# Patient Record
Sex: Male | Born: 1950 | Race: White | Hispanic: No | Marital: Married | Smoking: Current every day smoker
Health system: Southern US, Community
[De-identification: ages and names within clinical notes are randomized; demographics above are authoritative.]

## PROBLEM LIST (undated history)

## (undated) DIAGNOSIS — E119 Type 2 diabetes mellitus without complications: Secondary | ICD-10-CM

## (undated) HISTORY — PX: APPENDECTOMY: SHX54

## (undated) HISTORY — PX: TONSILLECTOMY: SUR1361

---

## 2005-11-27 ENCOUNTER — Encounter: Admission: RE | Admit: 2005-11-27 | Discharge: 2005-11-27 | Payer: Self-pay | Admitting: Geriatric Medicine

## 2007-01-22 ENCOUNTER — Encounter: Admission: RE | Admit: 2007-01-22 | Discharge: 2007-01-22 | Payer: Self-pay | Admitting: Geriatric Medicine

## 2013-02-24 ENCOUNTER — Other Ambulatory Visit: Payer: Self-pay | Admitting: Geriatric Medicine

## 2013-02-24 DIAGNOSIS — F17200 Nicotine dependence, unspecified, uncomplicated: Secondary | ICD-10-CM

## 2013-02-25 ENCOUNTER — Ambulatory Visit
Admission: RE | Admit: 2013-02-25 | Discharge: 2013-02-25 | Disposition: A | Discharge status: Home / Self Care | Payer: No Typology Code available for payment source | Source: Ambulatory Visit | Attending: Geriatric Medicine | Admitting: Geriatric Medicine

## 2013-02-25 DIAGNOSIS — F17200 Nicotine dependence, unspecified, uncomplicated: Secondary | ICD-10-CM

## 2013-03-12 ENCOUNTER — Other Ambulatory Visit: Payer: Self-pay | Admitting: Internal Medicine

## 2015-03-02 ENCOUNTER — Other Ambulatory Visit: Payer: Self-pay | Admitting: Geriatric Medicine

## 2015-03-02 DIAGNOSIS — F17200 Nicotine dependence, unspecified, uncomplicated: Secondary | ICD-10-CM

## 2015-03-03 ENCOUNTER — Other Ambulatory Visit: Payer: Self-pay | Admitting: Geriatric Medicine

## 2015-03-13 ENCOUNTER — Ambulatory Visit
Admission: RE | Admit: 2015-03-13 | Discharge: 2015-03-13 | Disposition: A | Discharge status: Home / Self Care | Payer: BLUE CROSS/BLUE SHIELD | Source: Ambulatory Visit | Attending: Geriatric Medicine | Admitting: Geriatric Medicine

## 2015-03-13 DIAGNOSIS — F17200 Nicotine dependence, unspecified, uncomplicated: Secondary | ICD-10-CM

## 2015-08-29 DIAGNOSIS — F17200 Nicotine dependence, unspecified, uncomplicated: Secondary | ICD-10-CM | POA: Diagnosis not present

## 2015-08-29 DIAGNOSIS — Z79899 Other long term (current) drug therapy: Secondary | ICD-10-CM | POA: Diagnosis not present

## 2015-08-29 DIAGNOSIS — E119 Type 2 diabetes mellitus without complications: Secondary | ICD-10-CM | POA: Diagnosis not present

## 2015-08-29 DIAGNOSIS — E7800 Pure hypercholesterolemia, unspecified: Secondary | ICD-10-CM | POA: Diagnosis not present

## 2015-08-29 DIAGNOSIS — Z7984 Long term (current) use of oral hypoglycemic drugs: Secondary | ICD-10-CM | POA: Diagnosis not present

## 2015-08-29 DIAGNOSIS — E669 Obesity, unspecified: Secondary | ICD-10-CM | POA: Diagnosis not present

## 2015-08-29 DIAGNOSIS — Z6831 Body mass index (BMI) 31.0-31.9, adult: Secondary | ICD-10-CM | POA: Diagnosis not present

## 2015-11-15 DIAGNOSIS — C44622 Squamous cell carcinoma of skin of right upper limb, including shoulder: Secondary | ICD-10-CM | POA: Diagnosis not present

## 2015-11-15 DIAGNOSIS — L570 Actinic keratosis: Secondary | ICD-10-CM | POA: Diagnosis not present

## 2015-11-15 DIAGNOSIS — L821 Other seborrheic keratosis: Secondary | ICD-10-CM | POA: Diagnosis not present

## 2015-11-15 DIAGNOSIS — D485 Neoplasm of uncertain behavior of skin: Secondary | ICD-10-CM | POA: Diagnosis not present

## 2015-11-15 DIAGNOSIS — Z85828 Personal history of other malignant neoplasm of skin: Secondary | ICD-10-CM | POA: Diagnosis not present

## 2015-12-27 DIAGNOSIS — E119 Type 2 diabetes mellitus without complications: Secondary | ICD-10-CM | POA: Diagnosis not present

## 2015-12-27 DIAGNOSIS — Z7984 Long term (current) use of oral hypoglycemic drugs: Secondary | ICD-10-CM | POA: Diagnosis not present

## 2016-04-01 ENCOUNTER — Other Ambulatory Visit: Payer: Self-pay | Admitting: Geriatric Medicine

## 2016-04-01 DIAGNOSIS — E119 Type 2 diabetes mellitus without complications: Secondary | ICD-10-CM | POA: Diagnosis not present

## 2016-04-01 DIAGNOSIS — F17219 Nicotine dependence, cigarettes, with unspecified nicotine-induced disorders: Secondary | ICD-10-CM

## 2016-04-01 DIAGNOSIS — F17210 Nicotine dependence, cigarettes, uncomplicated: Secondary | ICD-10-CM | POA: Diagnosis not present

## 2016-04-01 DIAGNOSIS — Z0000 Encounter for general adult medical examination without abnormal findings: Secondary | ICD-10-CM | POA: Diagnosis not present

## 2016-04-01 DIAGNOSIS — E669 Obesity, unspecified: Secondary | ICD-10-CM | POA: Diagnosis not present

## 2016-04-01 DIAGNOSIS — Z79899 Other long term (current) drug therapy: Secondary | ICD-10-CM | POA: Diagnosis not present

## 2016-04-01 DIAGNOSIS — E7800 Pure hypercholesterolemia, unspecified: Secondary | ICD-10-CM | POA: Diagnosis not present

## 2016-04-01 DIAGNOSIS — Z23 Encounter for immunization: Secondary | ICD-10-CM | POA: Diagnosis not present

## 2016-04-01 DIAGNOSIS — Z1389 Encounter for screening for other disorder: Secondary | ICD-10-CM | POA: Diagnosis not present

## 2016-04-01 DIAGNOSIS — R0981 Nasal congestion: Secondary | ICD-10-CM | POA: Diagnosis not present

## 2016-04-01 DIAGNOSIS — Z7984 Long term (current) use of oral hypoglycemic drugs: Secondary | ICD-10-CM | POA: Diagnosis not present

## 2016-04-01 DIAGNOSIS — Z6832 Body mass index (BMI) 32.0-32.9, adult: Secondary | ICD-10-CM | POA: Diagnosis not present

## 2016-04-08 ENCOUNTER — Ambulatory Visit
Admission: RE | Admit: 2016-04-08 | Discharge: 2016-04-08 | Disposition: A | Discharge status: Home / Self Care | Payer: BLUE CROSS/BLUE SHIELD | Source: Ambulatory Visit | Attending: Geriatric Medicine | Admitting: Geriatric Medicine

## 2016-04-08 DIAGNOSIS — F17219 Nicotine dependence, cigarettes, with unspecified nicotine-induced disorders: Secondary | ICD-10-CM

## 2016-04-08 DIAGNOSIS — Z87891 Personal history of nicotine dependence: Secondary | ICD-10-CM | POA: Diagnosis not present

## 2016-04-18 ENCOUNTER — Other Ambulatory Visit: Payer: Self-pay | Admitting: Gastroenterology

## 2016-04-24 DIAGNOSIS — F17200 Nicotine dependence, unspecified, uncomplicated: Secondary | ICD-10-CM | POA: Diagnosis not present

## 2016-06-04 ENCOUNTER — Encounter (HOSPITAL_COMMUNITY)
Admission: RE | Disposition: A | Discharge status: Home / Self Care | Payer: Self-pay | Source: Ambulatory Visit | Attending: Gastroenterology

## 2016-06-04 ENCOUNTER — Ambulatory Visit (HOSPITAL_COMMUNITY): Payer: Medicare Other | Admitting: Anesthesiology

## 2016-06-04 ENCOUNTER — Ambulatory Visit (HOSPITAL_COMMUNITY)
Admission: RE | Admit: 2016-06-04 | Discharge: 2016-06-04 | Disposition: A | Discharge status: Home / Self Care | Payer: Medicare Other | Source: Ambulatory Visit | Attending: Gastroenterology | Admitting: Gastroenterology

## 2016-06-04 ENCOUNTER — Encounter (HOSPITAL_COMMUNITY): Payer: Self-pay

## 2016-06-04 DIAGNOSIS — F17210 Nicotine dependence, cigarettes, uncomplicated: Secondary | ICD-10-CM | POA: Insufficient documentation

## 2016-06-04 DIAGNOSIS — Z1211 Encounter for screening for malignant neoplasm of colon: Secondary | ICD-10-CM | POA: Insufficient documentation

## 2016-06-04 DIAGNOSIS — E119 Type 2 diabetes mellitus without complications: Secondary | ICD-10-CM | POA: Insufficient documentation

## 2016-06-04 HISTORY — PX: COLONOSCOPY WITH PROPOFOL: SHX5780

## 2016-06-04 HISTORY — DX: Type 2 diabetes mellitus without complications: E11.9

## 2016-06-04 SURGERY — COLONOSCOPY WITH PROPOFOL
Anesthesia: Monitor Anesthesia Care

## 2016-06-04 MED ORDER — SODIUM CHLORIDE 0.9 % IV SOLN: INTRAVENOUS | Status: DC

## 2016-06-04 MED ORDER — PROPOFOL 10 MG/ML IV BOLUS
INTRAVENOUS | Status: AC
  Filled 2016-06-04: qty 20

## 2016-06-04 MED ORDER — LACTATED RINGERS IV SOLN
INTRAVENOUS | Status: DC
  Administered 2016-06-04: 12:00:00 via INTRAVENOUS

## 2016-06-04 MED ORDER — PROPOFOL 10 MG/ML IV BOLUS
INTRAVENOUS | Status: DC | PRN
  Administered 2016-06-04: 20 mg via INTRAVENOUS
  Administered 2016-06-04: 40 mg via INTRAVENOUS
  Administered 2016-06-04 (×2): 20 mg via INTRAVENOUS
  Administered 2016-06-04 (×4): 40 mg via INTRAVENOUS

## 2016-06-04 SURGICAL SUPPLY — 22 items

## 2016-06-04 NOTE — Op Note (Signed)
[facility] Patient Name: Lemichael Konig Procedure Date: 06/04/2016 MRN: [id number] Attending MD: Garlan Fair , MD Date of Birth: 17-Jun-1950 CSN: [id number] Age: 66 Admit Type: Outpatient Procedure:                Colonoscopy Indications:              Screening for colorectal malignant neoplasm Providers:                Garlan Fair, MD, Carmie End, RN, Elspeth Cho Tech., Technician, Dion Saucier, CRNA Referring MD:              Medicines:                Propofol per Anesthesia Complications:            No immediate complications. Estimated Blood Loss:     Estimated blood loss: none. Procedure:                Pre-Anesthesia Assessment:                           - Prior to the procedure, a History and Physical                            was performed, and patient medications and                            allergies were reviewed. The patient's tolerance of                            previous anesthesia was also reviewed. The risks                            and benefits of the procedure and the sedation                            options and risks were discussed with the patient.                            All questions were answered, and informed consent                            was obtained. Prior Anticoagulants: The patient has                            taken aspirin, last dose was day of procedure. ASA                            Grade Assessment: III - A patient with severe                            systemic disease. After reviewing the risks and  benefits, the patient was deemed in satisfactory                            condition to undergo the procedure.                           After obtaining informed consent, the colonoscope                            was passed under direct vision. Throughout the                            procedure, the patient's blood pressure, pulse, and              oxygen saturations were monitored continuously. The                            [id number] scope was introduced through                            the anus and advanced to the the cecum, identified                            by appendiceal orifice and ileocecal valve. The                            colonoscopy was somewhat difficult due to                            significant looping. The patient tolerated the                            procedure well. The quality of the bowel                            preparation was good. The appendiceal orifice and                            the rectum were photographed. Scope In: 1:14:43 PM Scope Out: 1:32:37 PM Scope Withdrawal Time: 0 hours 8 minutes 20 seconds  Total Procedure Duration: 0 hours 17 minutes 54 seconds  Findings:      The perianal and digital rectal examinations were normal.      The entire examined colon appeared normal. Impression:               - The entire examined colon is normal.                           - No specimens collected. Moderate Sedation:      N/A- Per Anesthesia Care Recommendation:           - Patient has a contact number available for                            emergencies. The signs and symptoms of potential  delayed complications were discussed with the                            patient. Return to normal activities tomorrow.                            Written discharge instructions were provided to the                            patient.                           - Repeat colonoscopy in 10 years for screening                            purposes.                           - Resume previous diet.                           - Continue present medications. Procedure Code(s):        --- Professional ---                           [id number], Colorectal cancer screening; colonoscopy on                            individual not meeting criteria for high risk Diagnosis  Code(s):        --- Professional ---                           Z12.11, Encounter for screening for malignant                            neoplasm of colon CPT copyright 2016 [employer]. All rights reserved. The codes documented in this report are preliminary and upon coder review may  be revised to meet current compliance requirements. Earle Gell, MD Garlan Fair, MD 06/04/2016 1:40:13 PM This report has been signed electronically. Number of Addenda: 0

## 2016-06-04 NOTE — Transfer of Care (Signed)
Immediate Anesthesia Transfer of Care Note  Patient: Thomas Callahan  Procedure(s) Performed: Procedure(s): COLONOSCOPY WITH PROPOFOL (N/A)  Patient Location: PACU and Endoscopy Unit  Anesthesia Type:MAC  Level of Consciousness: awake, alert , oriented and patient cooperative  Airway & Oxygen Therapy: Patient Spontanous Breathing and Patient connected to face mask oxygen  Post-op Assessment: Report given to RN, Post -op Vital signs reviewed and stable and Patient moving all extremities  Post vital signs: Reviewed and stable  Last Vitals:  Vitals:   06/04/16 1127  BP: 138/85  Resp: 18  Temp: 36.4 C    Last Pain:  Vitals:   06/04/16 1127  TempSrc: Oral         Complications: No apparent anesthesia complications

## 2016-06-04 NOTE — Anesthesia Preprocedure Evaluation (Signed)
Anesthesia Evaluation  Patient identified by MRN, date of birth, ID band Patient awake    Reviewed: Allergy & Precautions, NPO status , Patient's Chart, lab work & pertinent test results  Airway Mallampati: II  TM Distance: >3 FB Neck ROM: Full    Dental no notable dental hx.    Pulmonary Current Smoker,    Pulmonary exam normal breath sounds clear to auscultation       Cardiovascular negative cardio ROS Normal cardiovascular exam Rhythm:Regular Rate:Normal     Neuro/Psych negative neurological ROS  negative psych ROS   GI/Hepatic negative GI ROS, Neg liver ROS,   Endo/Other  diabetes  Renal/GU negative Renal ROS  negative genitourinary   Musculoskeletal negative musculoskeletal ROS (+)   Abdominal   Peds negative pediatric ROS (+)  Hematology negative hematology ROS (+)   Anesthesia Other Findings   Reproductive/Obstetrics negative OB ROS                             Anesthesia Physical Anesthesia Plan  ASA: III  Anesthesia Plan: MAC   Post-op Pain Management:    Induction: Intravenous  Airway Management Planned: Nasal Cannula  Additional Equipment:   Intra-op Plan:   Post-operative Plan:   Informed Consent: I have reviewed the patients History and Physical, chart, labs and discussed the procedure including the risks, benefits and alternatives for the proposed anesthesia with the patient or authorized representative who has indicated his/her understanding and acceptance.   Dental advisory given  Plan Discussed with: CRNA and Surgeon  Anesthesia Plan Comments:         Anesthesia Quick Evaluation

## 2016-06-04 NOTE — Discharge Instructions (Signed)

## 2016-06-04 NOTE — Anesthesia Postprocedure Evaluation (Addendum)
Anesthesia Post Note  Patient: Thomas Callahan  Procedure(s) Performed: Procedure(s) (LRB): COLONOSCOPY WITH PROPOFOL (N/A)  Patient location during evaluation: PACU Anesthesia Type: MAC Level of consciousness: awake and alert Pain management: pain level controlled Vital Signs Assessment: post-procedure vital signs reviewed and stable Respiratory status: spontaneous breathing, nonlabored ventilation, respiratory function stable and patient connected to nasal cannula oxygen Cardiovascular status: stable and blood pressure returned to baseline Anesthetic complications: no       Last Vitals:  Vitals:   06/04/16 1127 06/04/16 1339  BP: 138/85 (!) 154/93  Pulse:  75  Resp: 18 (!) 23  Temp: 36.4 C 36.6 C    Last Pain:  Vitals:   06/04/16 1339  TempSrc: Oral                 Driana Dazey S

## 2016-06-04 NOTE — H&P (Signed)
Procedure: Screening colonoscopy. Normal screening colonoscopy was performed on 12/31/2005  History: The patient is a 66 year old male born 1950-11-29. He is scheduled to undergo a repeat screening colonoscopy today  Past medical history: Type 2 diabetes mellitus. Nasal fracture surgery. Inguinal hernia repair. Arm fracture surgery. Current cigarette smoker.  Exam: The patient is alert and lying comfortably on the endoscopy stretcher. Abdomen is soft and nontender to palpation. Lungs are clear to auscultation. Cardiac exam reveals a regular rhythm.  Plan: Proceed with screening colonoscopy

## 2016-06-05 ENCOUNTER — Encounter (HOSPITAL_COMMUNITY): Payer: Self-pay | Admitting: Gastroenterology

## 2016-09-23 DIAGNOSIS — Z79899 Other long term (current) drug therapy: Secondary | ICD-10-CM | POA: Diagnosis not present

## 2016-09-23 DIAGNOSIS — Z7984 Long term (current) use of oral hypoglycemic drugs: Secondary | ICD-10-CM | POA: Diagnosis not present

## 2016-09-23 DIAGNOSIS — E7800 Pure hypercholesterolemia, unspecified: Secondary | ICD-10-CM | POA: Diagnosis not present

## 2016-09-23 DIAGNOSIS — E119 Type 2 diabetes mellitus without complications: Secondary | ICD-10-CM | POA: Diagnosis not present

## 2016-09-23 DIAGNOSIS — F17210 Nicotine dependence, cigarettes, uncomplicated: Secondary | ICD-10-CM | POA: Diagnosis not present

## 2016-10-07 NOTE — Addendum Note (Signed)
Addendum  created 10/07/16 1033 by Truda Staub, MD   Sign clinical note    

## 2016-11-14 DIAGNOSIS — L814 Other melanin hyperpigmentation: Secondary | ICD-10-CM | POA: Diagnosis not present

## 2016-11-14 DIAGNOSIS — D1801 Hemangioma of skin and subcutaneous tissue: Secondary | ICD-10-CM | POA: Diagnosis not present

## 2016-11-14 DIAGNOSIS — D225 Melanocytic nevi of trunk: Secondary | ICD-10-CM | POA: Diagnosis not present

## 2016-11-14 DIAGNOSIS — L821 Other seborrheic keratosis: Secondary | ICD-10-CM | POA: Diagnosis not present

## 2016-11-14 DIAGNOSIS — D485 Neoplasm of uncertain behavior of skin: Secondary | ICD-10-CM | POA: Diagnosis not present

## 2016-11-14 DIAGNOSIS — L570 Actinic keratosis: Secondary | ICD-10-CM | POA: Diagnosis not present

## 2016-11-14 DIAGNOSIS — Z85828 Personal history of other malignant neoplasm of skin: Secondary | ICD-10-CM | POA: Diagnosis not present

## 2016-11-14 DIAGNOSIS — C44619 Basal cell carcinoma of skin of left upper limb, including shoulder: Secondary | ICD-10-CM | POA: Diagnosis not present

## 2017-03-19 ENCOUNTER — Other Ambulatory Visit: Payer: Self-pay | Admitting: Geriatric Medicine

## 2017-03-19 DIAGNOSIS — E7800 Pure hypercholesterolemia, unspecified: Secondary | ICD-10-CM | POA: Diagnosis not present

## 2017-03-19 DIAGNOSIS — E119 Type 2 diabetes mellitus without complications: Secondary | ICD-10-CM | POA: Diagnosis not present

## 2017-03-19 DIAGNOSIS — F17210 Nicotine dependence, cigarettes, uncomplicated: Secondary | ICD-10-CM

## 2017-03-19 DIAGNOSIS — Z23 Encounter for immunization: Secondary | ICD-10-CM | POA: Diagnosis not present

## 2017-03-19 DIAGNOSIS — Z125 Encounter for screening for malignant neoplasm of prostate: Secondary | ICD-10-CM | POA: Diagnosis not present

## 2017-03-19 DIAGNOSIS — Z0000 Encounter for general adult medical examination without abnormal findings: Secondary | ICD-10-CM | POA: Diagnosis not present

## 2017-03-19 DIAGNOSIS — Z79899 Other long term (current) drug therapy: Secondary | ICD-10-CM | POA: Diagnosis not present

## 2017-03-19 DIAGNOSIS — Z1389 Encounter for screening for other disorder: Secondary | ICD-10-CM | POA: Diagnosis not present

## 2017-03-19 DIAGNOSIS — Z87891 Personal history of nicotine dependence: Secondary | ICD-10-CM

## 2017-03-19 DIAGNOSIS — E669 Obesity, unspecified: Secondary | ICD-10-CM | POA: Diagnosis not present

## 2017-03-19 DIAGNOSIS — D7589 Other specified diseases of blood and blood-forming organs: Secondary | ICD-10-CM | POA: Diagnosis not present

## 2017-03-19 DIAGNOSIS — Z7984 Long term (current) use of oral hypoglycemic drugs: Secondary | ICD-10-CM | POA: Diagnosis not present

## 2017-03-26 ENCOUNTER — Ambulatory Visit
Admission: RE | Admit: 2017-03-26 | Discharge: 2017-03-26 | Disposition: A | Discharge status: Home / Self Care | Payer: Medicare Other | Source: Ambulatory Visit | Attending: Geriatric Medicine | Admitting: Geriatric Medicine

## 2017-03-26 DIAGNOSIS — Z87891 Personal history of nicotine dependence: Secondary | ICD-10-CM

## 2017-03-26 DIAGNOSIS — Z136 Encounter for screening for cardiovascular disorders: Secondary | ICD-10-CM | POA: Diagnosis not present

## 2017-03-26 DIAGNOSIS — F17210 Nicotine dependence, cigarettes, uncomplicated: Secondary | ICD-10-CM

## 2017-09-19 DIAGNOSIS — Z7984 Long term (current) use of oral hypoglycemic drugs: Secondary | ICD-10-CM | POA: Diagnosis not present

## 2017-09-19 DIAGNOSIS — Z6831 Body mass index (BMI) 31.0-31.9, adult: Secondary | ICD-10-CM | POA: Diagnosis not present

## 2017-09-19 DIAGNOSIS — E119 Type 2 diabetes mellitus without complications: Secondary | ICD-10-CM | POA: Diagnosis not present

## 2017-09-19 DIAGNOSIS — F17210 Nicotine dependence, cigarettes, uncomplicated: Secondary | ICD-10-CM | POA: Diagnosis not present

## 2017-09-19 DIAGNOSIS — Z79899 Other long term (current) drug therapy: Secondary | ICD-10-CM | POA: Diagnosis not present

## 2017-09-19 DIAGNOSIS — E669 Obesity, unspecified: Secondary | ICD-10-CM | POA: Diagnosis not present

## 2017-11-18 DIAGNOSIS — L821 Other seborrheic keratosis: Secondary | ICD-10-CM | POA: Diagnosis not present

## 2017-11-18 DIAGNOSIS — D485 Neoplasm of uncertain behavior of skin: Secondary | ICD-10-CM | POA: Diagnosis not present

## 2017-11-18 DIAGNOSIS — L570 Actinic keratosis: Secondary | ICD-10-CM | POA: Diagnosis not present

## 2017-11-18 DIAGNOSIS — Z85828 Personal history of other malignant neoplasm of skin: Secondary | ICD-10-CM | POA: Diagnosis not present

## 2018-03-20 DIAGNOSIS — Z0000 Encounter for general adult medical examination without abnormal findings: Secondary | ICD-10-CM | POA: Diagnosis not present

## 2018-03-20 DIAGNOSIS — E7800 Pure hypercholesterolemia, unspecified: Secondary | ICD-10-CM | POA: Diagnosis not present

## 2018-03-20 DIAGNOSIS — F17200 Nicotine dependence, unspecified, uncomplicated: Secondary | ICD-10-CM | POA: Diagnosis not present

## 2018-03-20 DIAGNOSIS — E119 Type 2 diabetes mellitus without complications: Secondary | ICD-10-CM | POA: Diagnosis not present

## 2018-03-20 DIAGNOSIS — J309 Allergic rhinitis, unspecified: Secondary | ICD-10-CM | POA: Diagnosis not present

## 2018-03-20 DIAGNOSIS — Z1389 Encounter for screening for other disorder: Secondary | ICD-10-CM | POA: Diagnosis not present

## 2018-03-20 DIAGNOSIS — Z23 Encounter for immunization: Secondary | ICD-10-CM | POA: Diagnosis not present

## 2018-03-20 DIAGNOSIS — Z79899 Other long term (current) drug therapy: Secondary | ICD-10-CM | POA: Diagnosis not present

## 2018-03-20 DIAGNOSIS — Z125 Encounter for screening for malignant neoplasm of prostate: Secondary | ICD-10-CM | POA: Diagnosis not present

## 2018-03-20 DIAGNOSIS — F17210 Nicotine dependence, cigarettes, uncomplicated: Secondary | ICD-10-CM | POA: Diagnosis not present

## 2018-07-23 DIAGNOSIS — E119 Type 2 diabetes mellitus without complications: Secondary | ICD-10-CM | POA: Diagnosis not present

## 2018-09-16 DIAGNOSIS — E1169 Type 2 diabetes mellitus with other specified complication: Secondary | ICD-10-CM | POA: Diagnosis not present

## 2018-09-16 DIAGNOSIS — R21 Rash and other nonspecific skin eruption: Secondary | ICD-10-CM | POA: Diagnosis not present

## 2018-11-24 DIAGNOSIS — D1801 Hemangioma of skin and subcutaneous tissue: Secondary | ICD-10-CM | POA: Diagnosis not present

## 2018-11-24 DIAGNOSIS — Z85828 Personal history of other malignant neoplasm of skin: Secondary | ICD-10-CM | POA: Diagnosis not present

## 2018-11-24 DIAGNOSIS — L570 Actinic keratosis: Secondary | ICD-10-CM | POA: Diagnosis not present

## 2018-11-24 DIAGNOSIS — D225 Melanocytic nevi of trunk: Secondary | ICD-10-CM | POA: Diagnosis not present

## 2018-11-24 DIAGNOSIS — L821 Other seborrheic keratosis: Secondary | ICD-10-CM | POA: Diagnosis not present

## 2018-12-17 DIAGNOSIS — E1169 Type 2 diabetes mellitus with other specified complication: Secondary | ICD-10-CM | POA: Diagnosis not present

## 2018-12-25 DIAGNOSIS — E7800 Pure hypercholesterolemia, unspecified: Secondary | ICD-10-CM | POA: Diagnosis not present

## 2018-12-25 DIAGNOSIS — E119 Type 2 diabetes mellitus without complications: Secondary | ICD-10-CM | POA: Diagnosis not present

## 2018-12-25 DIAGNOSIS — E1169 Type 2 diabetes mellitus with other specified complication: Secondary | ICD-10-CM | POA: Diagnosis not present

## 2019-03-03 DIAGNOSIS — Z23 Encounter for immunization: Secondary | ICD-10-CM | POA: Diagnosis not present

## 2019-04-05 ENCOUNTER — Other Ambulatory Visit: Payer: Self-pay | Admitting: Geriatric Medicine

## 2019-04-05 DIAGNOSIS — Z79899 Other long term (current) drug therapy: Secondary | ICD-10-CM | POA: Diagnosis not present

## 2019-04-05 DIAGNOSIS — R21 Rash and other nonspecific skin eruption: Secondary | ICD-10-CM | POA: Diagnosis not present

## 2019-04-05 DIAGNOSIS — F17209 Nicotine dependence, unspecified, with unspecified nicotine-induced disorders: Secondary | ICD-10-CM

## 2019-04-05 DIAGNOSIS — Z0000 Encounter for general adult medical examination without abnormal findings: Secondary | ICD-10-CM | POA: Diagnosis not present

## 2019-04-05 DIAGNOSIS — Z1389 Encounter for screening for other disorder: Secondary | ICD-10-CM | POA: Diagnosis not present

## 2019-04-05 DIAGNOSIS — Z125 Encounter for screening for malignant neoplasm of prostate: Secondary | ICD-10-CM | POA: Diagnosis not present

## 2019-04-05 DIAGNOSIS — Z23 Encounter for immunization: Secondary | ICD-10-CM | POA: Diagnosis not present

## 2019-04-05 DIAGNOSIS — F17211 Nicotine dependence, cigarettes, in remission: Secondary | ICD-10-CM | POA: Diagnosis not present

## 2019-04-05 DIAGNOSIS — E1169 Type 2 diabetes mellitus with other specified complication: Secondary | ICD-10-CM | POA: Diagnosis not present

## 2019-04-05 DIAGNOSIS — E7800 Pure hypercholesterolemia, unspecified: Secondary | ICD-10-CM | POA: Diagnosis not present

## 2019-04-07 ENCOUNTER — Ambulatory Visit
Admission: RE | Admit: 2019-04-07 | Discharge: 2019-04-07 | Disposition: A | Discharge status: Home / Self Care | Payer: Medicare Other | Source: Ambulatory Visit | Attending: Geriatric Medicine | Admitting: Geriatric Medicine

## 2019-04-07 DIAGNOSIS — Z87891 Personal history of nicotine dependence: Secondary | ICD-10-CM | POA: Diagnosis not present

## 2019-04-07 DIAGNOSIS — F17209 Nicotine dependence, unspecified, with unspecified nicotine-induced disorders: Secondary | ICD-10-CM

## 2019-06-10 DIAGNOSIS — Z23 Encounter for immunization: Secondary | ICD-10-CM | POA: Diagnosis not present

## 2019-06-25 DIAGNOSIS — E1169 Type 2 diabetes mellitus with other specified complication: Secondary | ICD-10-CM | POA: Diagnosis not present

## 2019-06-25 DIAGNOSIS — E7800 Pure hypercholesterolemia, unspecified: Secondary | ICD-10-CM | POA: Diagnosis not present

## 2019-08-03 DIAGNOSIS — E1169 Type 2 diabetes mellitus with other specified complication: Secondary | ICD-10-CM | POA: Diagnosis not present

## 2019-08-03 DIAGNOSIS — Z79899 Other long term (current) drug therapy: Secondary | ICD-10-CM | POA: Diagnosis not present

## 2019-08-03 DIAGNOSIS — I700 Atherosclerosis of aorta: Secondary | ICD-10-CM | POA: Diagnosis not present

## 2019-08-03 DIAGNOSIS — E7800 Pure hypercholesterolemia, unspecified: Secondary | ICD-10-CM | POA: Diagnosis not present

## 2019-08-03 DIAGNOSIS — Z7984 Long term (current) use of oral hypoglycemic drugs: Secondary | ICD-10-CM | POA: Diagnosis not present

## 2019-08-03 DIAGNOSIS — Z23 Encounter for immunization: Secondary | ICD-10-CM | POA: Diagnosis not present

## 2019-11-24 DIAGNOSIS — D485 Neoplasm of uncertain behavior of skin: Secondary | ICD-10-CM | POA: Diagnosis not present

## 2019-11-24 DIAGNOSIS — C44622 Squamous cell carcinoma of skin of right upper limb, including shoulder: Secondary | ICD-10-CM | POA: Diagnosis not present

## 2019-11-24 DIAGNOSIS — L821 Other seborrheic keratosis: Secondary | ICD-10-CM | POA: Diagnosis not present

## 2019-11-24 DIAGNOSIS — D225 Melanocytic nevi of trunk: Secondary | ICD-10-CM | POA: Diagnosis not present

## 2019-11-24 DIAGNOSIS — D1801 Hemangioma of skin and subcutaneous tissue: Secondary | ICD-10-CM | POA: Diagnosis not present

## 2019-11-24 DIAGNOSIS — L570 Actinic keratosis: Secondary | ICD-10-CM | POA: Diagnosis not present

## 2019-11-24 DIAGNOSIS — Z85828 Personal history of other malignant neoplasm of skin: Secondary | ICD-10-CM | POA: Diagnosis not present

## 2019-11-24 DIAGNOSIS — L814 Other melanin hyperpigmentation: Secondary | ICD-10-CM | POA: Diagnosis not present

## 2020-03-01 DIAGNOSIS — Z23 Encounter for immunization: Secondary | ICD-10-CM | POA: Diagnosis not present

## 2020-03-14 DIAGNOSIS — Z23 Encounter for immunization: Secondary | ICD-10-CM | POA: Diagnosis not present

## 2020-04-05 ENCOUNTER — Other Ambulatory Visit: Payer: Self-pay | Admitting: Geriatric Medicine

## 2020-04-05 DIAGNOSIS — F17211 Nicotine dependence, cigarettes, in remission: Secondary | ICD-10-CM

## 2020-04-05 DIAGNOSIS — Z7984 Long term (current) use of oral hypoglycemic drugs: Secondary | ICD-10-CM | POA: Diagnosis not present

## 2020-04-05 DIAGNOSIS — E1169 Type 2 diabetes mellitus with other specified complication: Secondary | ICD-10-CM | POA: Diagnosis not present

## 2020-04-05 DIAGNOSIS — Z1389 Encounter for screening for other disorder: Secondary | ICD-10-CM | POA: Diagnosis not present

## 2020-04-05 DIAGNOSIS — Z125 Encounter for screening for malignant neoplasm of prostate: Secondary | ICD-10-CM | POA: Diagnosis not present

## 2020-04-05 DIAGNOSIS — I700 Atherosclerosis of aorta: Secondary | ICD-10-CM | POA: Diagnosis not present

## 2020-04-05 DIAGNOSIS — Z79899 Other long term (current) drug therapy: Secondary | ICD-10-CM | POA: Diagnosis not present

## 2020-04-05 DIAGNOSIS — E7800 Pure hypercholesterolemia, unspecified: Secondary | ICD-10-CM | POA: Diagnosis not present

## 2020-04-05 DIAGNOSIS — Z0000 Encounter for general adult medical examination without abnormal findings: Secondary | ICD-10-CM | POA: Diagnosis not present

## 2020-04-26 ENCOUNTER — Ambulatory Visit
Admission: RE | Admit: 2020-04-26 | Discharge: 2020-04-26 | Disposition: A | Discharge status: Home / Self Care | Payer: Medicare Other | Source: Ambulatory Visit | Attending: Geriatric Medicine | Admitting: Geriatric Medicine

## 2020-04-26 DIAGNOSIS — Z87891 Personal history of nicotine dependence: Secondary | ICD-10-CM | POA: Diagnosis not present

## 2020-04-26 DIAGNOSIS — F17211 Nicotine dependence, cigarettes, in remission: Secondary | ICD-10-CM

## 2020-04-26 DIAGNOSIS — I2510 Atherosclerotic heart disease of native coronary artery without angina pectoris: Secondary | ICD-10-CM | POA: Diagnosis not present

## 2020-04-26 DIAGNOSIS — I358 Other nonrheumatic aortic valve disorders: Secondary | ICD-10-CM | POA: Diagnosis not present

## 2020-04-26 DIAGNOSIS — J432 Centrilobular emphysema: Secondary | ICD-10-CM | POA: Diagnosis not present

## 2020-12-26 IMAGING — CT CT CHEST LUNG CANCER SCREENING LOW DOSE W/O CM
2 of 5 series · 15 of 40 positions shown, 18 images · non-contrast
Comparison: Low-dose lung cancer screening CT chest dated
03/26/2017

CLINICAL DATA: 68-year-old male former smoker, quit 1 year ago,
with 49 pack-year history of smoking, for follow-up lung cancer
screening

EXAM:
CT CHEST WITHOUT CONTRAST LOW-DOSE FOR LUNG CANCER SCREENING
TECHNIQUE: Multidetector CT imaging of the chest was performed following the
standard protocol without IV contrast.

[Series 4: lung 1.00 br44 cor · coronal · 0.69mm/px · 3 of 355 slices shown]
[im 71/355  lung]
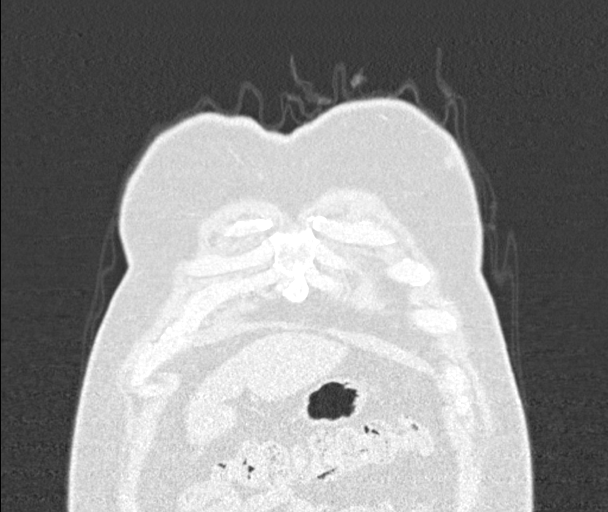
[im 142/355  lung]
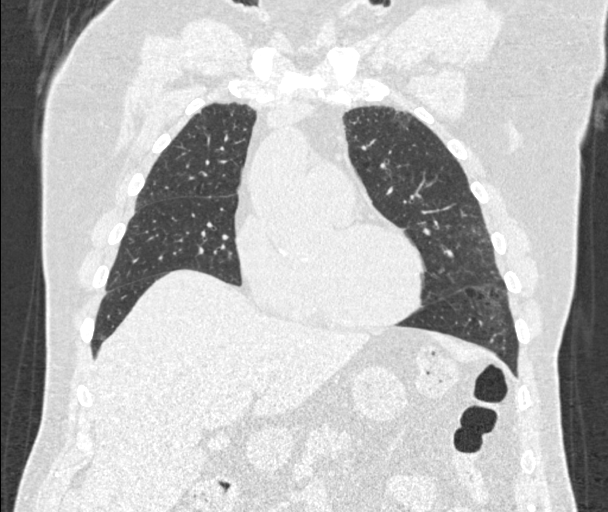
[im 213/355  lung]
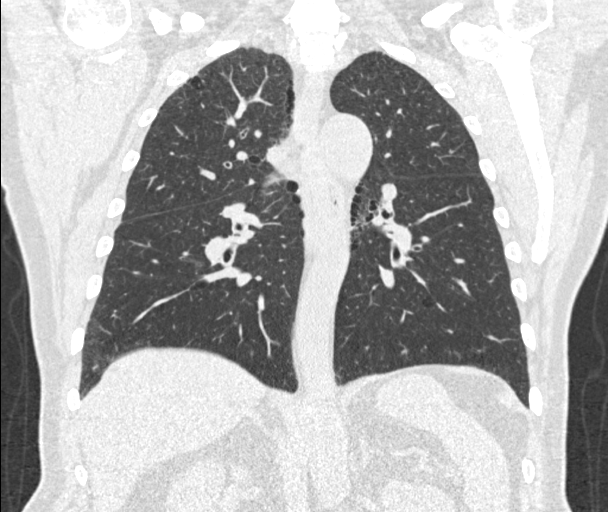

[Series 9: lung 1.00 br60 axial · axial · 0.82mm/px · z∈[-1057,-738]mm · 12 of 353 slices shown, 15 images]
[im 17/353  mediastinal]
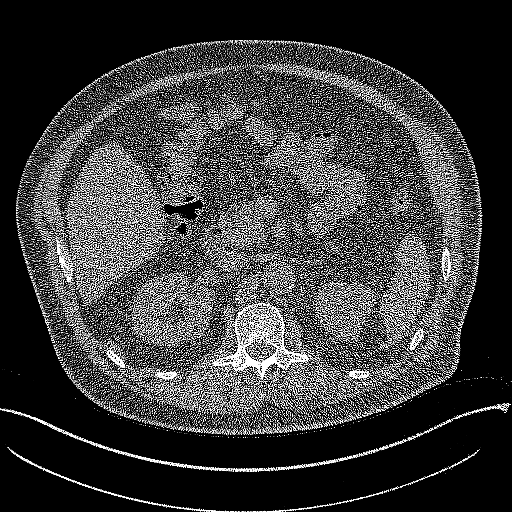
[im 17/353  lung]
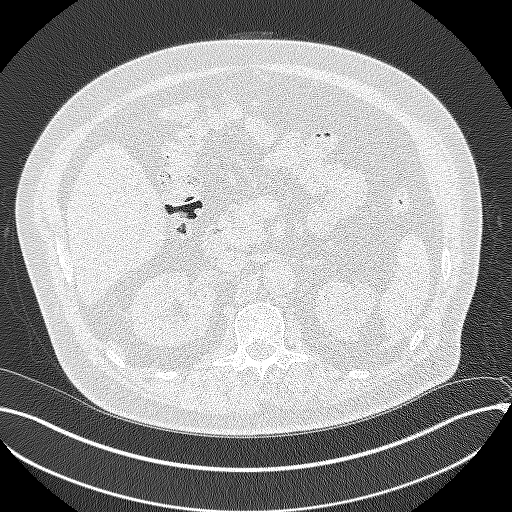
[im 51/353  lung]
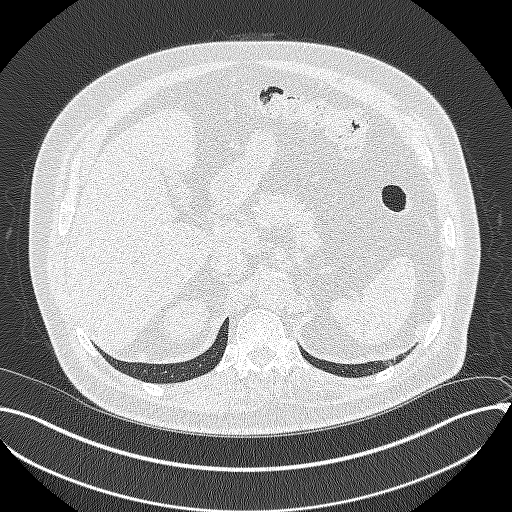
[im 84/353  lung]
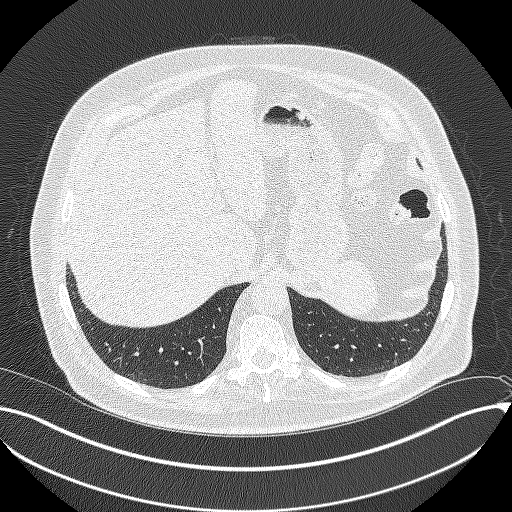
[im 101/353  lung]
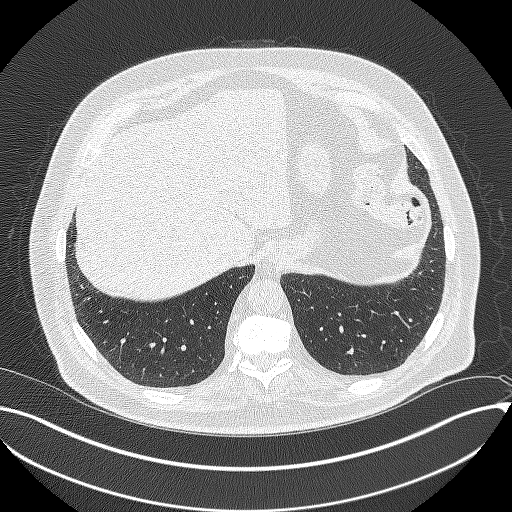
[im 135/353  mediastinal]
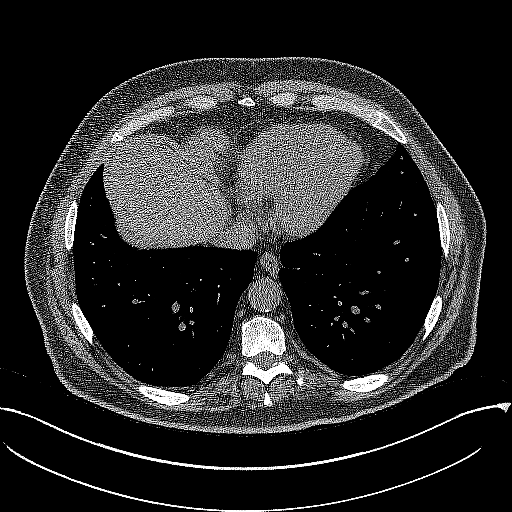
[im 135/353  lung]
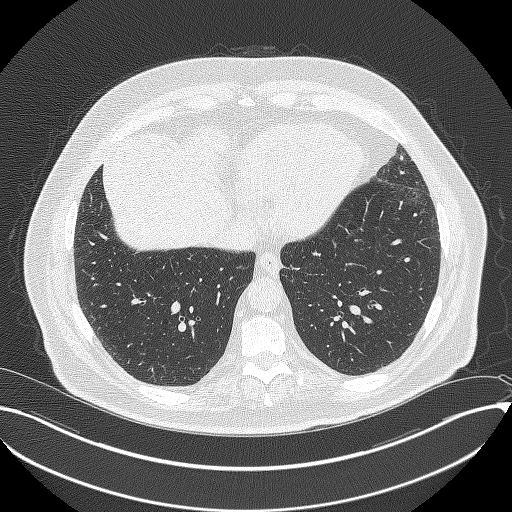
[im 168/353  lung]
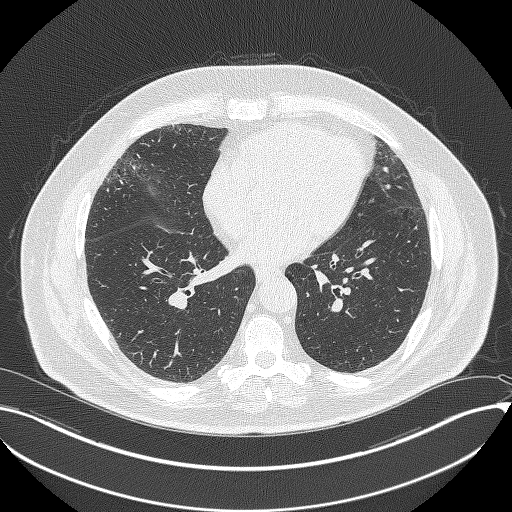
[im 185/353  lung]
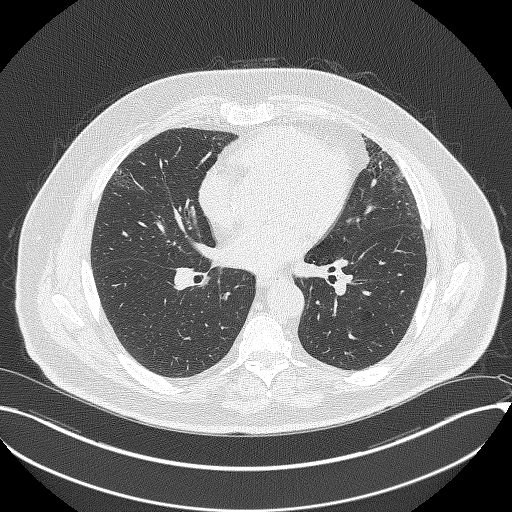
[im 218/353  lung]
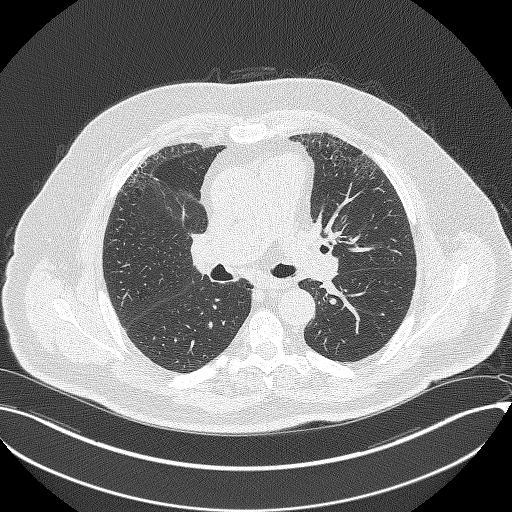
[im 252/353  mediastinal]
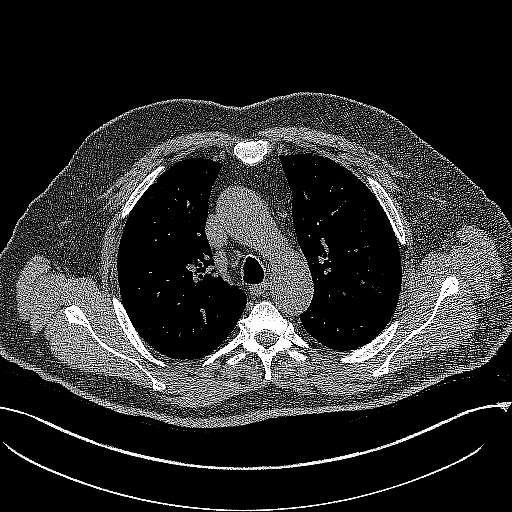
[im 252/353  lung]
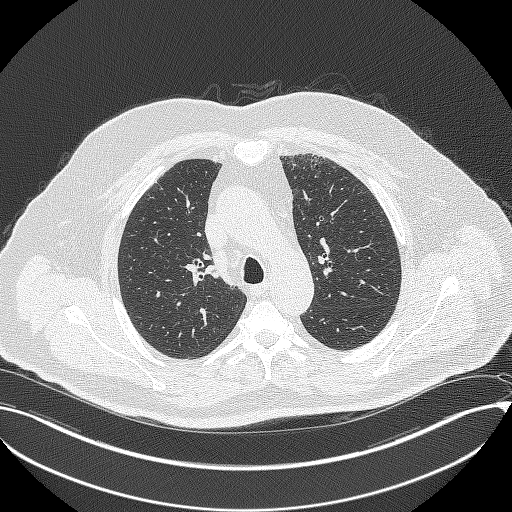
[im 269/353  lung]
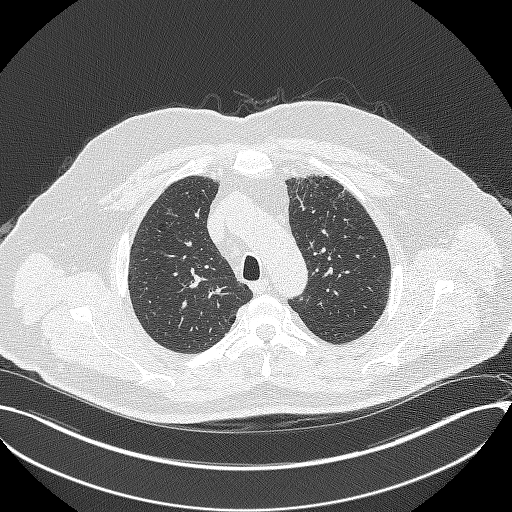
[im 302/353  lung]
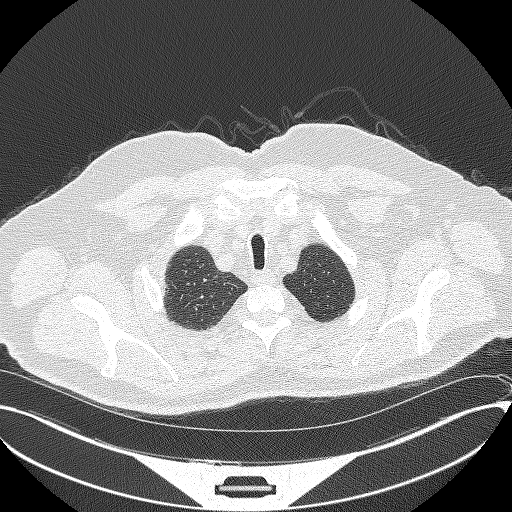
[im 336/353  lung]
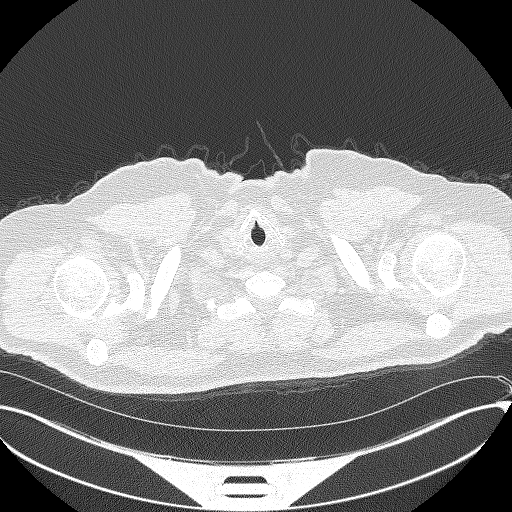

[15 of 40 positions shown; findings below may reference images not displayed]

FINDINGS: Cardiovascular: The heart is normal in size. No pericardial
effusion.

No evidence of thoracic aortic aneurysm. Mild atherosclerotic
calcifications of the aortic root.

Mild three-vessel coronary atherosclerosis.

Mediastinum/Nodes: No suspicious mediastinal lymphadenopathy.

Visualized thyroid is unremarkable.

Lungs/Pleura: Mild biapical pleural-parenchymal scarring.

Mild centrilobular and paraseptal emphysematous changes.

Subpleural reticulation/fibrosis anteriorly in the bilateral upper
lobes and right middle lobe, suggesting superimposed mild chronic
interstitial lung disease.

No focal consolidation.

No suspicious pulmonary nodules.

No pleural effusion or pneumothorax.

Upper Abdomen: Visualized upper abdomen is grossly unremarkable,
noting mild vascular calcifications.

Musculoskeletal: Visualized osseous structures are within normal
limits.
IMPRESSION: Lung-RADS 1, negative. Continue annual screening with low-dose chest
CT without contrast in 12 months.

Aortic Atherosclerosis (U30PR-RG3.3) and Emphysema (U30PR-9N6.U).

## 2021-04-14 ENCOUNTER — Other Ambulatory Visit: Payer: Self-pay | Admitting: Geriatric Medicine

## 2021-04-14 DIAGNOSIS — F17211 Nicotine dependence, cigarettes, in remission: Secondary | ICD-10-CM

## 2021-05-09 ENCOUNTER — Other Ambulatory Visit: Payer: Self-pay

## 2021-05-09 ENCOUNTER — Ambulatory Visit
Admission: RE | Admit: 2021-05-09 | Discharge: 2021-05-09 | Disposition: A | Discharge status: Home / Self Care | Payer: Medicare Other | Source: Ambulatory Visit | Attending: Geriatric Medicine | Admitting: Geriatric Medicine

## 2021-05-09 DIAGNOSIS — F17211 Nicotine dependence, cigarettes, in remission: Secondary | ICD-10-CM

## 2022-06-07 ENCOUNTER — Other Ambulatory Visit: Payer: Self-pay | Admitting: Internal Medicine

## 2022-06-07 DIAGNOSIS — J432 Centrilobular emphysema: Secondary | ICD-10-CM

## 2022-06-07 DIAGNOSIS — F17211 Nicotine dependence, cigarettes, in remission: Secondary | ICD-10-CM

## 2022-07-09 ENCOUNTER — Ambulatory Visit
Admission: RE | Admit: 2022-07-09 | Discharge: 2022-07-09 | Disposition: A | Discharge status: Home / Self Care | Payer: Medicare Other | Source: Ambulatory Visit | Attending: Internal Medicine | Admitting: Internal Medicine

## 2022-07-09 DIAGNOSIS — J432 Centrilobular emphysema: Secondary | ICD-10-CM

## 2022-07-09 DIAGNOSIS — F17211 Nicotine dependence, cigarettes, in remission: Secondary | ICD-10-CM

## 2023-06-12 ENCOUNTER — Other Ambulatory Visit: Payer: Self-pay | Admitting: Internal Medicine

## 2023-06-12 DIAGNOSIS — Z122 Encounter for screening for malignant neoplasm of respiratory organs: Secondary | ICD-10-CM

## 2023-06-17 ENCOUNTER — Other Ambulatory Visit: Payer: Self-pay | Admitting: Internal Medicine

## 2023-06-17 ENCOUNTER — Ambulatory Visit
Admission: RE | Admit: 2023-06-17 | Discharge: 2023-06-17 | Disposition: A | Discharge status: Home / Self Care | Payer: Medicare Other | Source: Ambulatory Visit | Attending: Internal Medicine | Admitting: Internal Medicine

## 2023-06-17 DIAGNOSIS — Z122 Encounter for screening for malignant neoplasm of respiratory organs: Secondary | ICD-10-CM

## 2023-07-03 ENCOUNTER — Other Ambulatory Visit (HOSPITAL_COMMUNITY): Payer: Self-pay | Admitting: Internal Medicine

## 2023-07-03 DIAGNOSIS — I359 Nonrheumatic aortic valve disorder, unspecified: Secondary | ICD-10-CM

## 2023-07-18 ENCOUNTER — Ambulatory Visit (HOSPITAL_COMMUNITY): Payer: Medicare Other | Attending: Internal Medicine

## 2023-07-18 DIAGNOSIS — I359 Nonrheumatic aortic valve disorder, unspecified: Secondary | ICD-10-CM | POA: Diagnosis present

## 2023-07-18 LAB — ECHOCARDIOGRAM COMPLETE
Area-P 1/2: 2.62 cm2
S' Lateral: 3.5 cm
